# Patient Record
Sex: Female | Born: 2000
Health system: Southern US, Community
[De-identification: ages and names within clinical notes are randomized; demographics above are authoritative.]

## PROBLEM LIST (undated history)

## (undated) DIAGNOSIS — J45909 Unspecified asthma, uncomplicated: Secondary | ICD-10-CM

---
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
## 2022-02-17 ENCOUNTER — Encounter (HOSPITAL_BASED_OUTPATIENT_CLINIC_OR_DEPARTMENT_OTHER): Payer: Self-pay | Admitting: Urology

## 2022-02-17 ENCOUNTER — Emergency Department (HOSPITAL_BASED_OUTPATIENT_CLINIC_OR_DEPARTMENT_OTHER)
Admission: EM | Admit: 2022-02-17 | Discharge: 2022-02-17 | Disposition: A | Discharge status: Home / Self Care | Payer: 59 | Attending: Emergency Medicine | Admitting: Emergency Medicine

## 2022-02-17 ENCOUNTER — Other Ambulatory Visit: Payer: Self-pay

## 2022-02-17 DIAGNOSIS — J45909 Unspecified asthma, uncomplicated: Secondary | ICD-10-CM | POA: Diagnosis not present

## 2022-02-17 DIAGNOSIS — R519 Headache, unspecified: Secondary | ICD-10-CM | POA: Diagnosis present

## 2022-02-17 DIAGNOSIS — U071 COVID-19: Secondary | ICD-10-CM | POA: Diagnosis not present

## 2022-02-17 MED ORDER — ALBUTEROL SULFATE HFA 108 (90 BASE) MCG/ACT IN AERS: 1.0000 | INHALATION_SPRAY | Freq: Four times a day (QID) | RESPIRATORY_TRACT | 0 refills | Status: AC | PRN

## 2022-02-17 NOTE — Discharge Instructions (Addendum)
You were seen in the emergency department today for body aches, sore throat, and nasal congestion.  We normally treat COVID with over-the-counter medications.  Symptoms can last for up to a week.  You can take ibuprofen or Tylenol for pain or fever, and I recommend alternating between the 2.  Make sure that you are drinking lots of fluids and getting plenty of rest. You can take decongestants as long as you take them with lots of water. You can use lozenges or chloraseptic spray as needed for sore throat.   Please use Tylenol or ibuprofen for pain.  You may use 800 mg ibuprofen every 6 hours or 1000 mg of Tylenol every 6 hours.  You may choose to alternate between the 2.  This would be most effective.  Do not exceed 4 g of Tylenol within 24 hours.  Do not exceed 3200 mg ibuprofen within 24 hours.  Continue to monitor how you are doing, and return to the emergency department for new or worsening symptoms such as chest pain, difficulty breathing not related to coughing, fever despite medication, or persistent vomiting or diarrhea.  It has been a pleasure taking care of you today and I hope you begin to feel better soon!

## 2022-02-17 NOTE — ED Provider Notes (Signed)
[facility] EMERGENCY DEPARTMENT Provider Note   CSN: [id number] Arrival date & time: 02/17/22  1322     History  Chief Complaint  Patient presents with   Covid Positive    Wendy Burns is a 21 y.o. female with history of asthma who presents the emergency department complaining of symptoms associated with COVID-19.  Patient reports that she took an at-home COVID test this morning, and it was positive.  She has had runny nose, nasal congestion, body aches, headache, and some shortness of breath.  Tried Tylenol this morning without relief.  HPI     Home Medications Prior to Admission medications   Medication Sig Start Date End Date Taking? Authorizing Provider  albuterol (VENTOLIN HFA) 108 (90 Base) MCG/ACT inhaler Inhale 1-2 puffs into the lungs every 6 (six) hours as needed for wheezing or shortness of breath. 02/17/22  Yes Beverely Suen T, PA-C  amoxicillin-clavulanate (AUGMENTIN) 875-125 MG tablet Take 1 tablet by mouth daily. 01/06/19   [provider]  benzonatate (TESSALON) 100 MG capsule Take 1-2 capsules (100-200 mg total) by mouth 3 (three) times daily as needed for cough. Patient not taking: Reported on 01/09/2019 04/14/15   Wallis Bamberg, PA-C  famotidine (PEPCID) 20 MG tablet Take 1 tablet (20 mg total) by mouth 2 (two) times daily. Patient not taking: Reported on 01/09/2019 10/08/16   Linwood Dibbles, MD  ibuprofen (ADVIL,MOTRIN) 400 MG tablet Take 1 tablet (400 mg total) by mouth every 6 (six) hours as needed. Patient not taking: Reported on 01/09/2019 10/08/16   Linwood Dibbles, MD      Allergies    Patient has no known allergies.    Review of Systems   Review of Systems  Constitutional:  Positive for fatigue. Negative for chills and fever.  HENT:  Positive for congestion, rhinorrhea, sinus pressure and sore throat.   Respiratory:  Positive for shortness of breath.   Musculoskeletal:  Positive for myalgias.  Neurological:  Positive for headaches.  All other  systems reviewed and are negative.   Physical Exam Updated Vital Signs BP 109/73 (BP Location: Right Arm)   Pulse 87   Temp 98.7 F (37.1 C) (Oral)   Resp 18   Ht 5\' 5"  (1.651 m)   Wt 95.3 kg   LMP 01/23/2022 (Exact Date)   SpO2 98%   BMI 34.95 kg/m  Physical Exam Vitals and nursing note reviewed.  Constitutional:      Appearance: Normal appearance.  HENT:     Head: Normocephalic and atraumatic.     Nose: Congestion present.     Mouth/Throat:     Lips: Pink.     Mouth: Mucous membranes are moist.     Pharynx: Oropharynx is clear. Uvula midline.     Tonsils: No tonsillar exudate or tonsillar abscesses.  Eyes:     Conjunctiva/sclera: Conjunctivae normal.  Cardiovascular:     Rate and Rhythm: Normal rate and regular rhythm.  Pulmonary:     Effort: Pulmonary effort is normal. No respiratory distress.     Breath sounds: Normal breath sounds.  Abdominal:     General: There is no distension.     Palpations: Abdomen is soft.     Tenderness: There is no abdominal tenderness.  Skin:    General: Skin is warm and dry.  Neurological:     General: No focal deficit present.     Mental Status: She is alert.     ED Results / Procedures / Treatments   Labs (all  labs ordered are listed, but only abnormal results are displayed) Labs Reviewed - No data to display  EKG None  Radiology No results found.  Procedures Procedures    Medications Ordered in ED Medications - No data to display  ED Course/ Medical Decision Making/ A&P                           Medical Decision Making This patient is a 21 y.o. female who presents to the ED for concern of symptoms associated with COVID-19. Positive COVID test this morning.    Past Medical History / Social History / Additional history: Chart reviewed. Pertinent results include: Asthma  Physical Exam: Physical exam performed. The pertinent findings include: Normal vital signs.  Clear lungs to auscultation.  HEENT exam grossly  normal.     Disposition: After consideration of the diagnostic results and the patients response to treatment, I feel that patient's not requiring admission.  Will discharge to home with symptomatic treatment of COVID-19, as well as refill of her albuterol inhaler as needed for asthma.  As she has normal oxygen saturation, is afebrile, with clear lung sounds, I have low suspicion for pneumonia.  Discussed reasons to return to the emergency department, and the patient is agreeable to the plan.   Final Clinical Impression(s) / ED Diagnoses Final diagnoses:  COVID-19    Rx / DC Orders ED Discharge Orders          Ordered    albuterol (VENTOLIN HFA) 108 (90 Base) MCG/ACT inhaler  Every 6 hours PRN        02/17/22 1523           Portions of this report may have been transcribed using voice recognition software. Every effort was made to ensure accuracy; however, inadvertent computerized transcription errors may be present.    Jeanella Flattery 02/17/22 1525    Derwood Kaplan, MD 02/18/22 [phone]

## 2022-02-17 NOTE — ED Triage Notes (Signed)
Pt states runny nose that started yesterday Took at home COVID test and was positive this am States weakness, SOB and body aches   H/o asthma, currently out of inhaler

## 2023-01-04 ENCOUNTER — Emergency Department (HOSPITAL_BASED_OUTPATIENT_CLINIC_OR_DEPARTMENT_OTHER)
Admission: EM | Admit: 2023-01-04 | Discharge: 2023-01-04 | Disposition: A | Discharge status: Home / Self Care | Payer: 59 | Attending: Emergency Medicine | Admitting: Emergency Medicine

## 2023-01-04 ENCOUNTER — Encounter (HOSPITAL_BASED_OUTPATIENT_CLINIC_OR_DEPARTMENT_OTHER): Payer: Self-pay

## 2023-01-04 ENCOUNTER — Other Ambulatory Visit: Payer: Self-pay

## 2023-01-04 ENCOUNTER — Emergency Department (HOSPITAL_BASED_OUTPATIENT_CLINIC_OR_DEPARTMENT_OTHER): Payer: 59

## 2023-01-04 DIAGNOSIS — J45909 Unspecified asthma, uncomplicated: Secondary | ICD-10-CM | POA: Diagnosis not present

## 2023-01-04 DIAGNOSIS — R070 Pain in throat: Secondary | ICD-10-CM | POA: Insufficient documentation

## 2023-01-04 HISTORY — DX: Unspecified asthma, uncomplicated: J45.909

## 2023-01-04 LAB — GROUP A STREP BY PCR: Group A Strep by PCR: NOT DETECTED

## 2023-01-04 MED ORDER — AZITHROMYCIN 250 MG PO TABS: 250.0000 mg | ORAL_TABLET | Freq: Every day | ORAL | 0 refills | Status: AC

## 2023-01-04 MED ORDER — IBUPROFEN 400 MG PO TABS
600.0000 mg | ORAL_TABLET | ORAL | Status: AC
  Administered 2023-01-04: 600 mg via ORAL
  Filled 2023-01-04: qty 1

## 2023-01-04 NOTE — ED Triage Notes (Signed)
Patient arrives POV with her mother c/o sore throat. Patient states she ate around 4pm today and shortly afterwards "felt like something was stuck" in the back of her throat. Patient reports throat pain 6/10. Patient reports eating soft food and chicken wings but states that she does not "it's not the chicken wing." Patient also reports food was spicy. Patient states that "she feels a bump on her uvula." On visualization, oropharynx was erythematous with red spot on right tonsil. Patient is a&o x4, in NAD.

## 2023-01-04 NOTE — ED Provider Notes (Signed)
[place] [facility]ERGENCY DEPARTMENT AT [facility] Provider Note   CSN: [id number] Arrival date & time: 01/04/23  8657     History {Add pertinent medical, surgical, social history, OB history to HPI:1} Chief Complaint  Patient presents with   Sore Throat    Wendy Burns is a 22 y.o. female   Sore Throat       Home Medications Prior to Admission medications   Medication Sig Start Date End Date Taking? Authorizing Provider  azithromycin (ZITHROMAX) 250 MG tablet Take 1 tablet (250 mg total) by mouth daily. Take first 2 tablets together, then 1 every day until finished. 01/04/23  Yes Jeanelle Malling, PA  albuterol (VENTOLIN HFA) 108 (90 Base) MCG/ACT inhaler Inhale 1-2 puffs into the lungs every 6 (six) hours as needed for wheezing or shortness of breath. 02/17/22   Roemhildt, Lorin T, PA-C  amoxicillin-clavulanate (AUGMENTIN) 875-125 MG tablet Take 1 tablet by mouth daily. 01/06/19   [provider]  benzonatate (TESSALON) 100 MG capsule Take 1-2 capsules (100-200 mg total) by mouth 3 (three) times daily as needed for cough. Patient not taking: Reported on 01/09/2019 04/14/15   Wallis Bamberg, PA-C  famotidine (PEPCID) 20 MG tablet Take 1 tablet (20 mg total) by mouth 2 (two) times daily. Patient not taking: Reported on 01/09/2019 10/08/16   Linwood Dibbles, MD  ibuprofen (ADVIL,MOTRIN) 400 MG tablet Take 1 tablet (400 mg total) by mouth every 6 (six) hours as needed. Patient not taking: Reported on 01/09/2019 10/08/16   Linwood Dibbles, MD      Allergies    Amoxicillin    Review of Systems   Review of Systems Negative except as per HPI.  Physical Exam Updated Vital Signs BP 113/71 (BP Location: Right Arm)   Pulse 75   Temp 97.7 F (36.5 C)   Resp 18   Ht 5\' 5"  (1.651 m)   Wt 88.5 kg   LMP 12/19/2022 (Exact Date)   SpO2 100%   BMI 32.45 kg/m  Physical Exam Vitals and nursing note reviewed.  Constitutional:      Appearance: Normal appearance.  HENT:     Head: Normocephalic and  atraumatic.     Mouth/Throat:     Mouth: Mucous membranes are moist.  Eyes:     General: No scleral icterus. Cardiovascular:     Rate and Rhythm: Normal rate and regular rhythm.     Pulses: Normal pulses.     Heart sounds: Normal heart sounds.  Pulmonary:     Effort: Pulmonary effort is normal.     Breath sounds: Normal breath sounds.  Abdominal:     General: Abdomen is flat.     Palpations: Abdomen is soft.     Tenderness: There is no abdominal tenderness.  Musculoskeletal:        General: No deformity.  Skin:    General: Skin is warm.     Findings: No rash.  Neurological:     General: No focal deficit present.     Mental Status: She is alert.  Psychiatric:        Mood and Affect: Mood normal.     ED Results / Procedures / Treatments   Labs (all labs ordered are listed, but only abnormal results are displayed) Labs Reviewed  GROUP A STREP BY PCR    EKG None  Radiology DG Neck Soft Tissue  Result Date: 01/04/2023 CLINICAL DATA:  Throat pain EXAM: NECK SOFT TISSUES - 1+ VIEW COMPARISON:  X-ray soft tissues of  the neck 01/08/2019 FINDINGS: There is no evidence of retropharyngeal soft tissue swelling or epiglottic enlargement. The cervical airway is unremarkable and no radio-opaque foreign body identified. There is soft tissue prominence of the adenoids. IMPRESSION: Prominence of the adenoids. No other significant radiographic findings. Electronically Signed   By: Darliss Cheney M.D.   On: 01/04/2023 22:15    Procedures Procedures  {Document cardiac monitor, telemetry assessment procedure when appropriate:1}  Medications Ordered in ED Medications  ibuprofen (ADVIL) tablet 600 mg (600 mg Oral Given 01/04/23 2026)    ED Course/ Medical Decision Making/ A&P   {   Click here for ABCD2, HEART and other calculatorsREFRESH Note before signing :1}                          Medical Decision Making Amount and/or Complexity of Data Reviewed Radiology:  ordered.  Risk Prescription drug management.   ***  {Document critical care time when appropriate:1} {Document review of labs and clinical decision tools ie heart score, Chads2Vasc2 etc:1}  {Document your independent review of radiology images, and any outside records:1} {Document your discussion with family members, caretakers, and with consultants:1} {Document social determinants of health affecting pt's care:1} {Document your decision making why or why not admission, treatments were needed:1} Final Clinical Impression(s) / ED Diagnoses Final diagnoses:  Throat pain    Rx / DC Orders ED Discharge Orders          Ordered    azithromycin (ZITHROMAX) 250 MG tablet  Daily        01/04/23 2259

## 2023-01-04 NOTE — Discharge Instructions (Addendum)
Please take your antibiotics as prescribed. Take tylenol/ibuprofen for pain. I recommend close follow-up with PCP for reevaluation.  Please do not hesitate to return to emergency department if worrisome signs symptoms we discussed become apparent.
